# Patient Record
Sex: Male | Born: 1985 | Hispanic: Yes | Marital: Single | Smoking: Current every day smoker
Health system: Southern US, Community
[De-identification: ages and names within clinical notes are randomized; demographics above are authoritative.]

---

## 2017-05-09 ENCOUNTER — Encounter: Payer: Self-pay | Admitting: Family Medicine

## 2017-05-09 ENCOUNTER — Ambulatory Visit (INDEPENDENT_AMBULATORY_CARE_PROVIDER_SITE_OTHER): Payer: Self-pay | Admitting: Family Medicine

## 2017-05-09 ENCOUNTER — Ambulatory Visit (INDEPENDENT_AMBULATORY_CARE_PROVIDER_SITE_OTHER): Payer: Self-pay

## 2017-05-09 VITALS — BP 139/86 | HR 88 | Temp 99.3°F | Resp 16 | Ht 66.0 in | Wt 170.2 lb

## 2017-05-09 DIAGNOSIS — S6992XA Unspecified injury of left wrist, hand and finger(s), initial encounter: Secondary | ICD-10-CM

## 2017-05-09 DIAGNOSIS — Z23 Encounter for immunization: Secondary | ICD-10-CM

## 2017-05-09 DIAGNOSIS — M79645 Pain in left finger(s): Secondary | ICD-10-CM

## 2017-05-09 MED ORDER — IBUPROFEN 600 MG PO TABS: 600.0000 mg | ORAL_TABLET | Freq: Three times a day (TID) | ORAL | 0 refills | Status: AC | PRN

## 2017-05-09 NOTE — Patient Instructions (Addendum)
  Return in clinic in one week   IF you received an x-ray today, you will receive an [facility] from [facility]. Please contact [facility] at [phone] with questions or concerns regarding your invoice.   IF you received labwork today, you will receive an invoice from [facility]. Please contact [facility] at [phone] with questions or concerns regarding your invoice.   Our billing staff will not be able to assist you with questions regarding bills from these companies.  You will be contacted with the lab results as soon as they are available. The fastest way to get your results is to activate your My Chart account. Instructions are located on the last page of this paperwork. If you have not heard from us regarding the results in 2 weeks, please contact this office.

## 2017-05-09 NOTE — Progress Notes (Signed)
  Chief Complaint  Patient presents with  . driven nail in left pinky    2 hrs ago    HPI   2 hours ago pt was working when a nail went through his 5th digit on the left hand The nail went straight through He does not have a tetanus He reports that he is able to bend his finger but it is swollen and hurts   No past medical history on file.  No current outpatient prescriptions on file.   No current facility-administered medications for this visit.     Allergies: No Known Allergies  No past surgical history on file.  Social History   Social History  . Marital status: Single    Spouse name: N/A  . Number of children: N/A  . Years of education: N/A   Social History Main Topics  . Smoking status: Current Every Day Smoker  . Smokeless tobacco: Never Used  . Alcohol use 14.4 oz/week    24 Standard drinks or equivalent per week  . Drug use: No  . Sexual activity: Not Asked   Other Topics Concern  . None   Social History Narrative  . None    ROS  Objective: Vitals:   05/09/17 1608  BP: 139/86  Pulse: 88  Resp: 16  Temp: 99.3 F (37.4 C)  TempSrc: Oral  SpO2: 99%  Weight: 170 lb 3.2 oz (77.2 kg)  Height: 5\' 6"  (1.676 m)    Physical Exam  Constitutional: He is oriented to person, place, and time. He appears well-developed and well-nourished.  HENT:  Head: Normocephalic and atraumatic.  Pulmonary/Chest: Effort normal.  Neurological: He is alert and oriented to person, place, and time.    Left fifth digit with soft tissue swelling with puncture lateral to the MCP joint Tender to palpation  Cap refill <2s Hand with normal grip strength  EXAM: RIGHT LITTLE FINGER 2+V  COMPARISON:  None.  FINDINGS: Swollen appearance of the little finger without fracture or opaque foreign body. No malalignment.  IMPRESSION: No fracture or opaque foreign body.   Electronically Signed   By: Marnee SpringJonathon  Watts M.D.   On: 05/09/2017 16:32 Assessment and  Plan Frederick Johnson was seen today for driven nail in left pinky.  Diagnoses and all orders for this visit:      Hand injury, left, initial encounter -     DG Finger Little Right  Pain of finger of left hand -     Apply finger splint static  Other orders  -     Td vaccine greater than or equal to 7yo preservative free IM  Gave tetanus today, no apparent tendon injury, good range of motion Advised to wear splint Ibuprofen for pain  Follow up in one week  Jere Vanburen A Creta LevinStallings

## 2018-07-06 IMAGING — DX DG FINGER LITTLE 2+V*R*
3 series · 3 of 3 positions shown · non-contrast
Comparison: None.

CLINICAL DATA: Hand injury due to nail through finger. Initial
encounter.

EXAM:
RIGHT LITTLE FINGER 2+V

[finger ap]
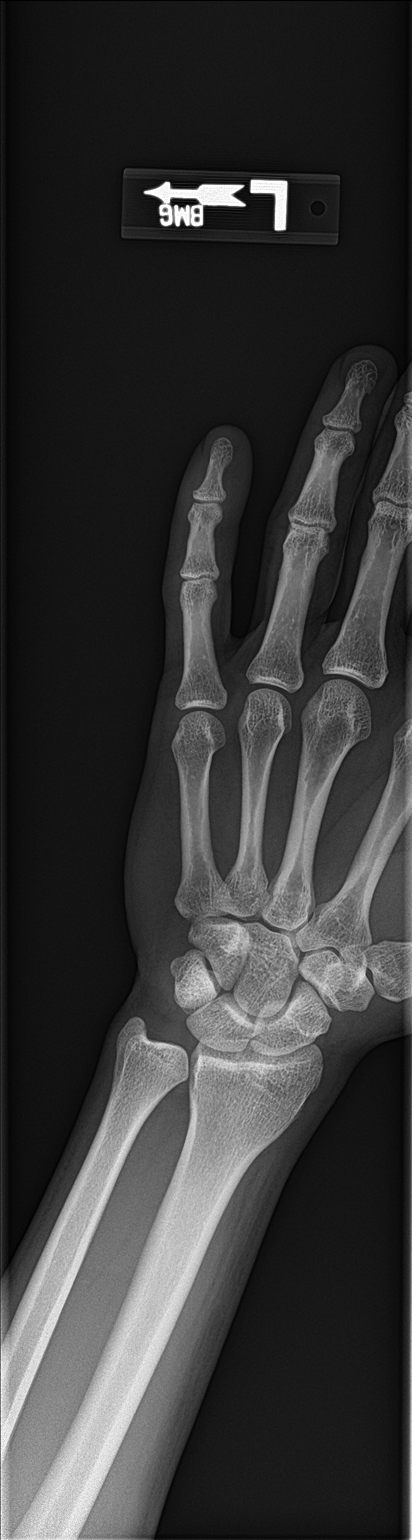

[finger obl]
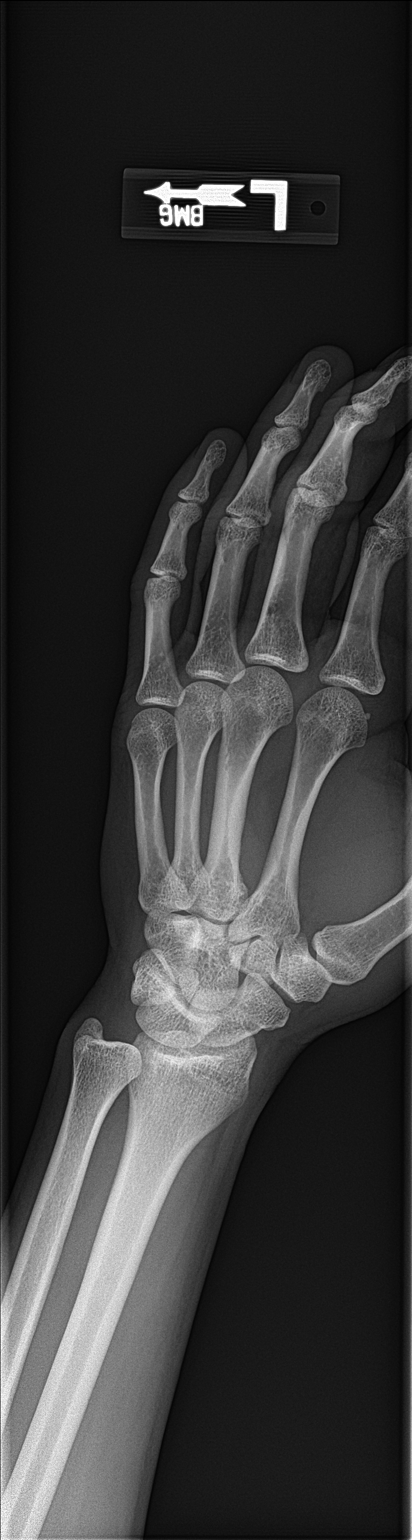

[finger lat]
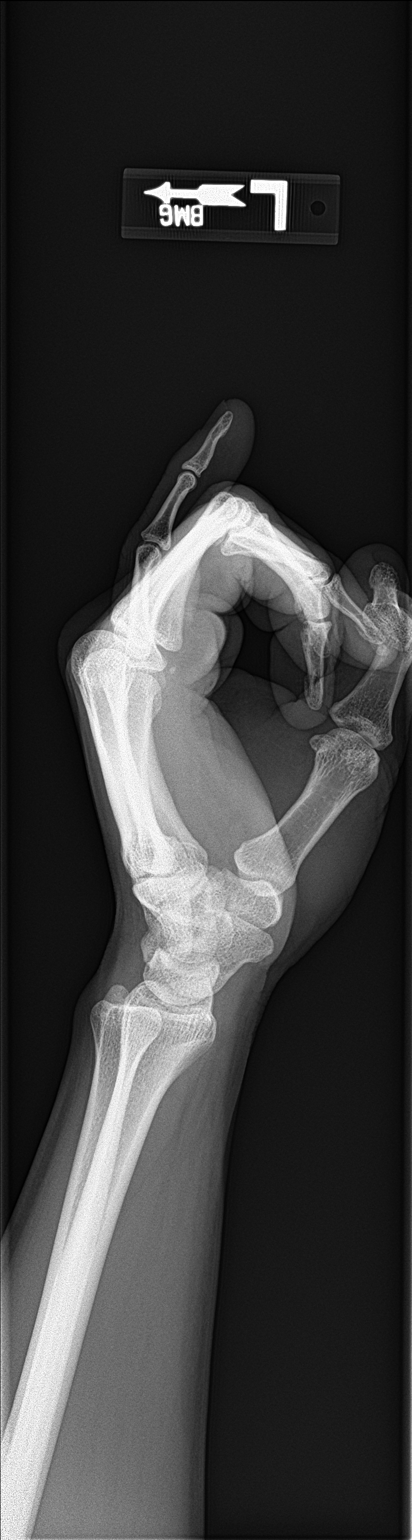

[3 of 3 positions shown; findings below may reference images not displayed]

FINDINGS: Swollen appearance of the little finger without fracture or opaque
foreign body. No malalignment.
IMPRESSION: No fracture or opaque foreign body.
